# Patient Record
Sex: Female | Born: 1965 | Race: Black or African American | Hispanic: No | Marital: Single | Smoking: Never smoker
Health system: Southern US, Community
[De-identification: ages and names within clinical notes are randomized; demographics above are authoritative.]

## PROBLEM LIST (undated history)

## (undated) DIAGNOSIS — C8590 Non-Hodgkin lymphoma, unspecified, unspecified site: Secondary | ICD-10-CM

## (undated) DIAGNOSIS — I10 Essential (primary) hypertension: Secondary | ICD-10-CM

## (undated) DIAGNOSIS — J45909 Unspecified asthma, uncomplicated: Secondary | ICD-10-CM

## (undated) DIAGNOSIS — C801 Malignant (primary) neoplasm, unspecified: Secondary | ICD-10-CM

## (undated) DIAGNOSIS — E119 Type 2 diabetes mellitus without complications: Secondary | ICD-10-CM

## (undated) HISTORY — PX: TUMOR REMOVAL: SHX12

## (undated) HISTORY — PX: CHOLECYSTECTOMY: SHX55

## (undated) HISTORY — PX: MYOMECTOMY: SHX85

---

## 2008-12-04 ENCOUNTER — Emergency Department (HOSPITAL_COMMUNITY): Admission: EM | Admit: 2008-12-04 | Discharge: 2008-12-04 | Payer: Self-pay | Admitting: Emergency Medicine

## 2010-10-17 LAB — D-DIMER, QUANTITATIVE: D-Dimer, Quant: 0.24 ug/mL-FEU (ref 0.00–0.48)

## 2016-12-20 ENCOUNTER — Encounter (HOSPITAL_COMMUNITY): Payer: Self-pay | Admitting: Family Medicine

## 2016-12-20 DIAGNOSIS — R1011 Right upper quadrant pain: Secondary | ICD-10-CM | POA: Insufficient documentation

## 2016-12-20 DIAGNOSIS — E119 Type 2 diabetes mellitus without complications: Secondary | ICD-10-CM | POA: Insufficient documentation

## 2016-12-20 DIAGNOSIS — I10 Essential (primary) hypertension: Secondary | ICD-10-CM | POA: Insufficient documentation

## 2016-12-20 DIAGNOSIS — R1084 Generalized abdominal pain: Secondary | ICD-10-CM | POA: Diagnosis present

## 2016-12-20 DIAGNOSIS — J45909 Unspecified asthma, uncomplicated: Secondary | ICD-10-CM | POA: Diagnosis not present

## 2016-12-20 LAB — CBC
HEMATOCRIT: 34 % — AB (ref 36.0–46.0)
Hemoglobin: 11.4 g/dL — ABNORMAL LOW (ref 12.0–15.0)
MCH: 28.4 pg (ref 26.0–34.0)
MCHC: 33.5 g/dL (ref 30.0–36.0)
MCV: 84.8 fL (ref 78.0–100.0)
Platelets: 260 10*3/uL (ref 150–400)
RBC: 4.01 MIL/uL (ref 3.87–5.11)
RDW: 14.7 % (ref 11.5–15.5)
WBC: 6.1 10*3/uL (ref 4.0–10.5)

## 2016-12-20 LAB — URINALYSIS, ROUTINE W REFLEX MICROSCOPIC
BACTERIA UA: NONE SEEN
Bilirubin Urine: NEGATIVE
Glucose, UA: NEGATIVE mg/dL
Ketones, ur: 5 mg/dL — AB
NITRITE: NEGATIVE
PROTEIN: 30 mg/dL — AB
SPECIFIC GRAVITY, URINE: 1.027 (ref 1.005–1.030)
pH: 6 (ref 5.0–8.0)

## 2016-12-20 LAB — COMPREHENSIVE METABOLIC PANEL
ALBUMIN: 3.6 g/dL (ref 3.5–5.0)
ALK PHOS: 63 U/L (ref 38–126)
ALT: 57 U/L — AB (ref 14–54)
AST: 54 U/L — AB (ref 15–41)
Anion gap: 9 (ref 5–15)
BILIRUBIN TOTAL: 0.4 mg/dL (ref 0.3–1.2)
BUN: 13 mg/dL (ref 6–20)
CALCIUM: 8.9 mg/dL (ref 8.9–10.3)
CO2: 25 mmol/L (ref 22–32)
Chloride: 102 mmol/L (ref 101–111)
Creatinine, Ser: 0.83 mg/dL (ref 0.44–1.00)
GFR calc Af Amer: >60 mL/min (ref >60)
GFR calc non Af Amer: >60 mL/min (ref >60)
GLUCOSE: 179 mg/dL — AB (ref 65–99)
Potassium: 3.3 mmol/L — ABNORMAL LOW (ref 3.5–5.1)
Sodium: 136 mmol/L (ref 135–145)
TOTAL PROTEIN: 7.2 g/dL (ref 6.5–8.1)

## 2016-12-20 LAB — LIPASE, BLOOD: Lipase: 33 U/L (ref 11–51)

## 2016-12-20 NOTE — ED Triage Notes (Signed)
Patient is complaining of right sided abd pain that started 2 weeks ago but got worse yesterday. Also, complains of diarrhea, loss of appetite, and gas.

## 2016-12-21 ENCOUNTER — Emergency Department (HOSPITAL_COMMUNITY): Payer: Managed Care, Other (non HMO)

## 2016-12-21 ENCOUNTER — Encounter (HOSPITAL_COMMUNITY): Payer: Self-pay

## 2016-12-21 ENCOUNTER — Emergency Department (HOSPITAL_COMMUNITY)
Admission: EM | Admit: 2016-12-21 | Discharge: 2016-12-21 | Disposition: A | Discharge status: Home / Self Care | Payer: Managed Care, Other (non HMO) | Attending: Emergency Medicine | Admitting: Emergency Medicine

## 2016-12-21 DIAGNOSIS — R1011 Right upper quadrant pain: Secondary | ICD-10-CM

## 2016-12-21 HISTORY — DX: Essential (primary) hypertension: I10

## 2016-12-21 HISTORY — DX: Non-Hodgkin lymphoma, unspecified, unspecified site: C85.90

## 2016-12-21 HISTORY — DX: Malignant (primary) neoplasm, unspecified: C80.1

## 2016-12-21 HISTORY — DX: Type 2 diabetes mellitus without complications: E11.9

## 2016-12-21 HISTORY — DX: Unspecified asthma, uncomplicated: J45.909

## 2016-12-21 MED ORDER — IOPAMIDOL (ISOVUE-300) INJECTION 61%
100.0000 mL | Freq: Once | INTRAVENOUS | Status: AC | PRN
  Administered 2016-12-21: 100 mL via INTRAVENOUS

## 2016-12-21 MED ORDER — IOPAMIDOL (ISOVUE-300) INJECTION 61%
INTRAVENOUS | Status: AC
  Filled 2016-12-21: qty 100

## 2016-12-21 MED ORDER — OMEPRAZOLE 20 MG PO CPDR: 20.0000 mg | DELAYED_RELEASE_CAPSULE | Freq: Every day | ORAL | 0 refills | Status: AC

## 2016-12-21 MED ORDER — SUCRALFATE 1 GM/10ML PO SUSP: 1.0000 g | Freq: Three times a day (TID) | ORAL | 0 refills | Status: AC

## 2016-12-21 MED ORDER — HYDROCODONE-ACETAMINOPHEN 5-325 MG PO TABS: 1.0000 | ORAL_TABLET | Freq: Four times a day (QID) | ORAL | 0 refills | Status: AC | PRN

## 2016-12-21 MED ORDER — GI COCKTAIL ~~LOC~~
30.0000 mL | Freq: Once | ORAL | Status: AC
  Administered 2016-12-21: 30 mL via ORAL
  Filled 2016-12-21: qty 30

## 2016-12-21 NOTE — ED Provider Notes (Signed)
[facility] DEPT Provider Note   CSN: [id number] Arrival date & time: 12/20/16  2249     History   Chief Complaint Chief Complaint  Patient presents with  . Abdominal Pain    HPI Donna Pena is a 51 y.o. female.  Patient with PMH of T-cell lymphoma, DM, and HTN presents to the ED with a chief complaint of upper abdominal pain.  She reports that she has had the pain for the past 2 weeks, but it significantly worsened in the past 3 days.  She has prior surgical hx of cholecystectomy. She denies any fevers, chills, n/v/d.  She states that her symptoms do worsen after eating.  She has not tried taking anything for her symptoms.  The symptoms are also worsened with palpation.  Last chemo 3 weeks ago.   The history is provided by the patient. No language interpreter was used.    Past Medical History:  Diagnosis Date  . Asthma   . Cancer ([place])   . Diabetes mellitus without complication ([place])   . Hypertension   . T-cell lymphoma ([place])    On the skin.     There are no active problems to display for this patient.   Past Surgical History:  Procedure Laterality Date  . CHOLECYSTECTOMY    . MYOMECTOMY    . TUMOR REMOVAL     In abd     OB History    No data available       Home Medications    Prior to Admission medications   Not on File    Family History History reviewed. No pertinent family history.  Social History Social History  Substance Use Topics  . Smoking status: Never Smoker  . Smokeless tobacco: Never Used  . Alcohol use No     Allergies   Patient has no allergy information on record.   Review of Systems Review of Systems  All other systems reviewed and are negative.    Physical Exam Updated Vital Signs BP (!) 154/92 (BP Location: Left Arm)   Pulse (!) 103   Temp 99.1 F (37.3 C) (Oral)   Resp 18   Ht 5\' 4"  (1.626 m)   Wt (!) 143.3 kg (316 lb)   LMP 12/18/2016   SpO2 98%   BMI 54.24 kg/m   Physical Exam  Constitutional:  She is oriented to person, place, and time. She appears well-developed and well-nourished.  HENT:  Head: Normocephalic and atraumatic.  Eyes: Conjunctivae and EOM are normal. Pupils are equal, round, and reactive to light.  Neck: Normal range of motion. Neck supple.  Cardiovascular: Normal rate and regular rhythm.  Exam reveals no gallop and no friction rub.   No murmur heard. Pulmonary/Chest: Effort normal and breath sounds normal. No respiratory distress. She has no wheezes. She has no rales. She exhibits no tenderness.  Abdominal: Soft. Bowel sounds are normal. She exhibits no distension and no mass. There is tenderness. There is no rebound and no guarding.  Right upper abdominal tenderness, no other focal abdominal tenderness  Musculoskeletal: Normal range of motion. She exhibits no edema or tenderness.  Neurological: She is alert and oriented to person, place, and time.  Skin: Skin is warm and dry.  Psychiatric: She has a normal mood and affect. Her behavior is normal. Judgment and thought content normal.  Nursing note and vitals reviewed.    ED Treatments / Results  Labs (all labs ordered are listed, but only abnormal results are displayed) Labs Reviewed  COMPREHENSIVE METABOLIC PANEL - Abnormal; Notable for the following:       Result Value   Potassium 3.3 (*)    Glucose, Bld 179 (*)    AST 54 (*)    ALT 57 (*)    All other components within normal limits  CBC - Abnormal; Notable for the following:    Hemoglobin 11.4 (*)    HCT 34.0 (*)    All other components within normal limits  URINALYSIS, ROUTINE W REFLEX MICROSCOPIC - Abnormal; Notable for the following:    APPearance HAZY (*)    Hgb urine dipstick LARGE (*)    Ketones, ur 5 (*)    Protein, ur 30 (*)    Leukocytes, UA TRACE (*)    Squamous Epithelial / LPF 6-30 (*)    All other components within normal limits  LIPASE, BLOOD    EKG  EKG Interpretation None       Radiology No results  found.  Procedures Procedures (including critical care time)  Medications Ordered in ED Medications  gi cocktail (Maalox,Lidocaine,Donnatal) (not administered)     Initial Impression / Assessment and Plan / ED Course  I have reviewed the triage vital signs and the nursing notes.  Pertinent labs & imaging results that were available during my care of the patient were reviewed by me and considered in my medical decision making (see chart for details).     Patient with worsening RUQ abdominal pain.  Barely elevated LFTs.  Prior cholecystectomy 11 years ago.  VSS.  Afebrile.  Patient is fairly tender to palpation.  Given history of cancer and tenderness, will check CT.  Will also try GI cocktail.  No improvement with GI cocktail.  CT shows no acute process.  She does have some enlarged lymph nodes which are likely related to her lymphoma.  She has no evidence of infection.  Her pain does get worse with eating.  Consider PUD.  Will try omeprazole and Carafate, but recommend close follow-up with PCP.  Patient instructed to return for:  New or worsening symptoms, including, increased abdominal pain, especially pain that localizes to one side, bloody vomit, bloody diarrhea, fever >101, and intractable vomiting.   Final Clinical Impressions(s) / ED Diagnoses   Final diagnoses:  RUQ pain    New Prescriptions Discharge Medication List as of 12/21/2016  3:30 AM    START taking these medications   Details  HYDROcodone-acetaminophen (NORCO/VICODIN) 5-325 MG tablet Take 1-2 tablets by mouth every 6 (six) hours as needed., Starting Fri 12/21/2016, Print    omeprazole (PRILOSEC) 20 MG capsule Take 1 capsule (20 mg total) by mouth daily., Starting Fri 12/21/2016, Print    sucralfate (CARAFATE) 1 GM/10ML suspension Take 10 mLs (1 g total) by mouth 4 (four) times daily -  with meals and at bedtime., Starting Fri 12/21/2016, Print         Montine Circle, PA-C 12/21/16 Luevenia Maxin    Rolland Porter,  MD 12/21/16 [phone]

## 2016-12-21 NOTE — ED Notes (Signed)
Patient transported to CT 

## 2018-04-24 IMAGING — CT CT ABD-PELV W/ CM
2 of 5 series · 16 of 46 positions shown, 18 images · IV contrast (isovue)
Comparison: None.

CLINICAL DATA: Right upper abdominal pain for 2 weeks, worse in the
last 24 hours

EXAM:
CT ABDOMEN AND PELVIS WITH CONTRAST
TECHNIQUE: Multidetector CT imaging of the abdomen and pelvis was performed
using the standard protocol following bolus administration of
intravenous contrast.
CONTRAST:  100 cc [id number] intravenous

[Series 2: abd/pel with · axial · 0.85mm/px · z∈[-678,-278]mm · 13 of 92 slices shown, 15 images]
[im 6/92  soft-tissue]
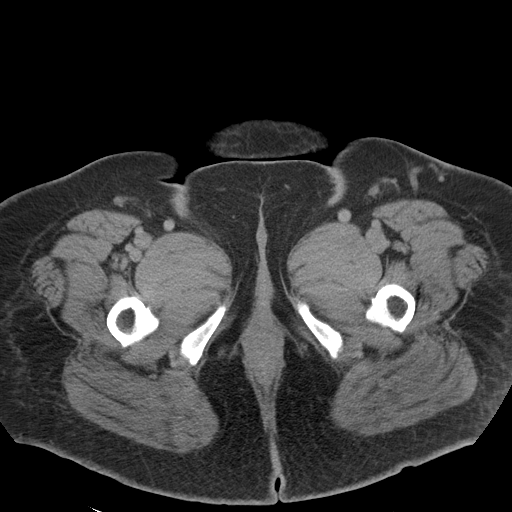
[im 6/92  bone]
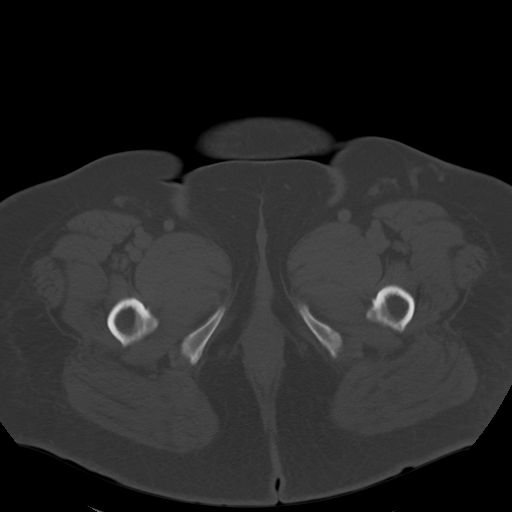
[im 12/92  soft-tissue]
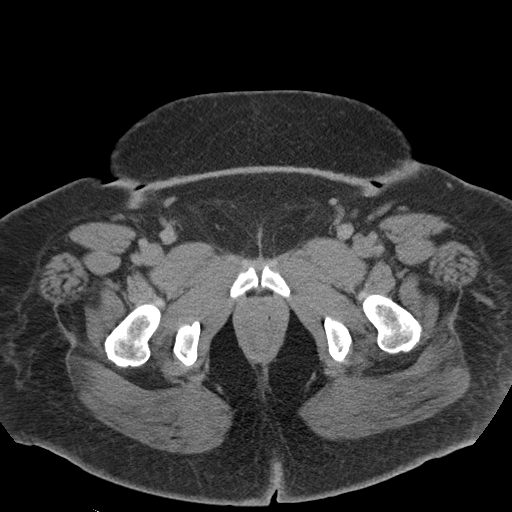
[im 18/92  soft-tissue]
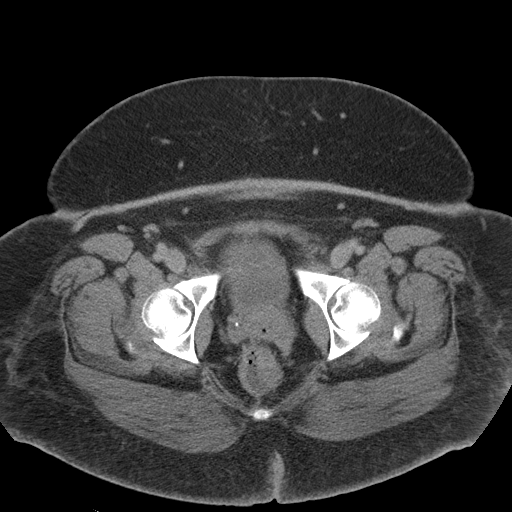
[im 29/92  soft-tissue]
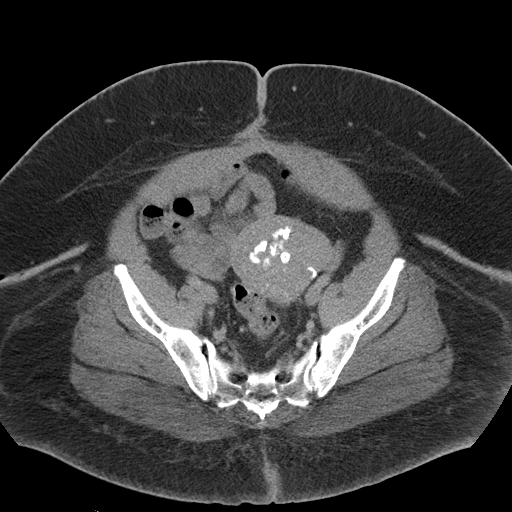
[im 35/92  soft-tissue]
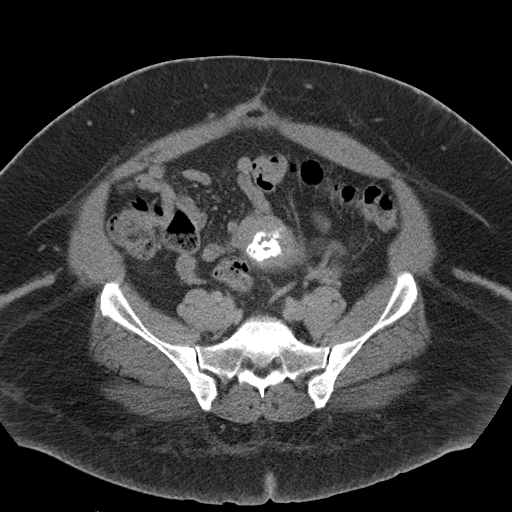
[im 40/92  soft-tissue]
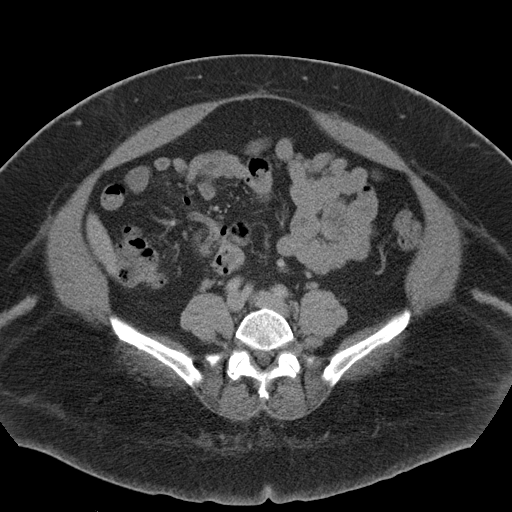
[im 46/92  soft-tissue]
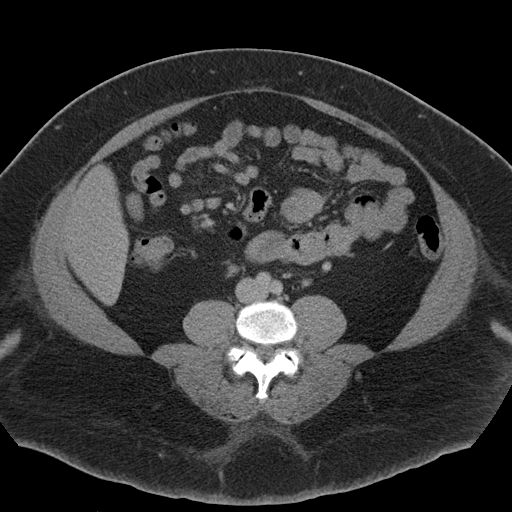
[im 52/92  soft-tissue]
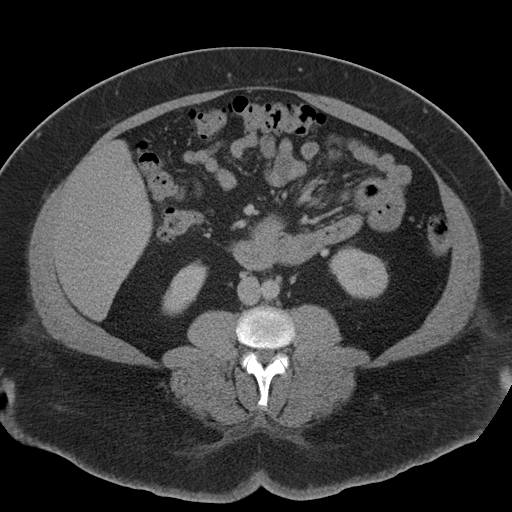
[im 57/92  soft-tissue]
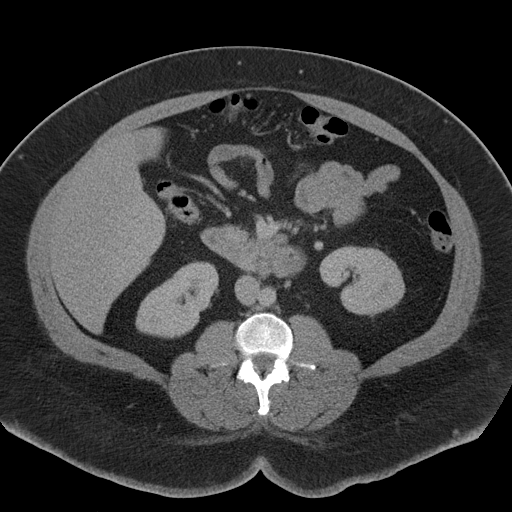
[im 57/92  bone]
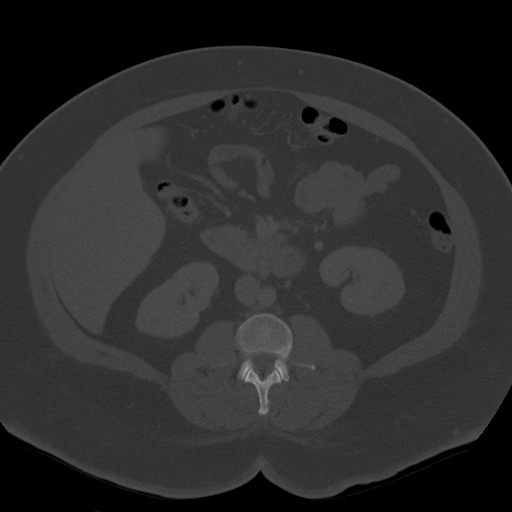
[im 63/92  soft-tissue]
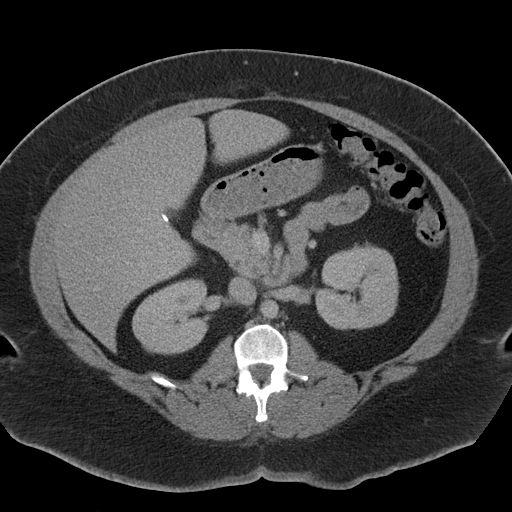
[im 74/92  soft-tissue]
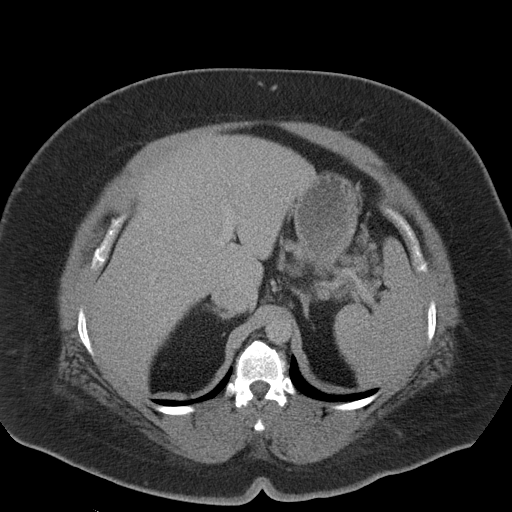
[im 80/92  soft-tissue]
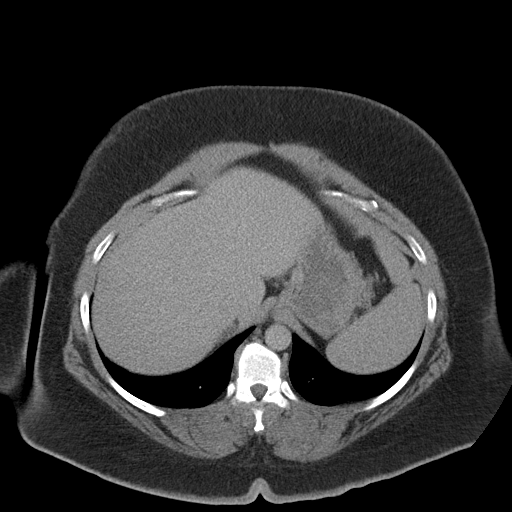
[im 86/92  soft-tissue]
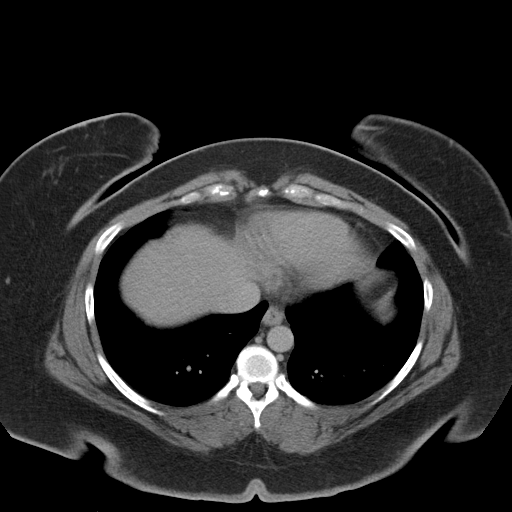

[Series 5: coronal a/|p · coronal · 0.81mm/px · 3 of 179 slices shown]
[im 60/179  soft-tissue]
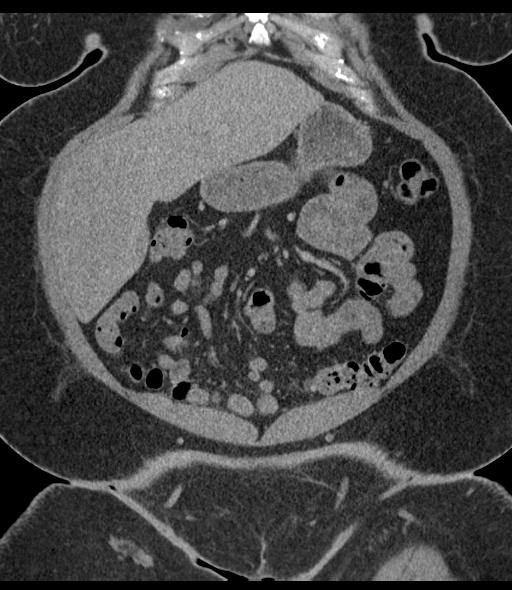
[im 80/179  soft-tissue]
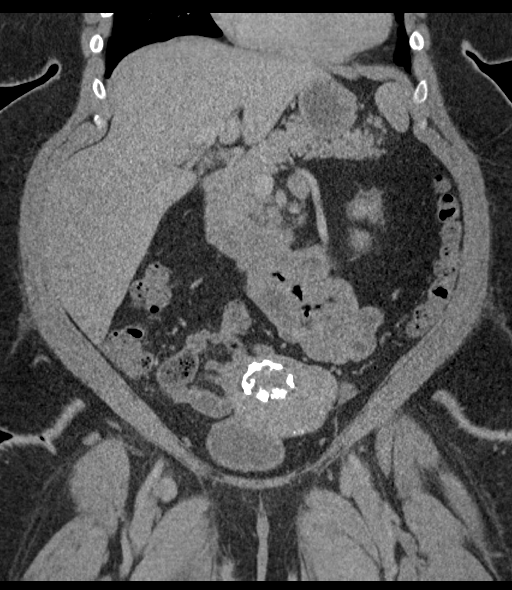
[im 99/179  soft-tissue]
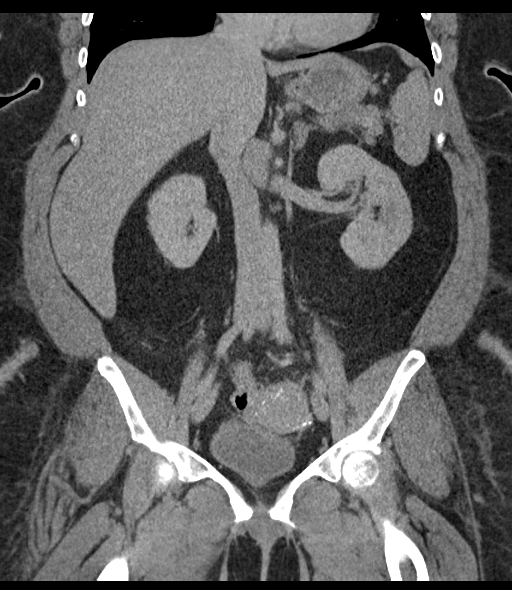

[16 of 46 positions shown; findings below may reference images not displayed]

FINDINGS: Lower chest: Lung bases demonstrate no acute consolidation or
pleural effusion. Heart size within normal limits.

Hepatobiliary: No focal liver abnormality is seen. Status post
cholecystectomy. No biliary dilatation.

Pancreas: Unremarkable. No pancreatic ductal dilatation or
surrounding inflammatory changes.

Spleen: Normal in size without focal abnormality.

Adrenals/Urinary Tract: Adrenal glands are within normal limits.
Kidneys show no hydronephrosis. The bladder is unremarkable.

Stomach/Bowel: Stomach is within normal limits. Appendix appears
normal. No evidence of bowel wall thickening, distention, or
inflammatory changes.

Vascular/Lymphatic: Non aneurysmal aorta. No retroperitoneal nodes.
Enlarged portal caval lymph node measuring 22 mm. Multiple clustered
lymph nodes at the splenic hilum and adjacent to the tail of the
pancreas, measuring up to 1 cm. Matted gastrohepatic lymph nodes.

Reproductive: Enlarged uterus with multiple calcified masses
presumably fibroids. No adnexal mass.

Other: No free air or free fluid. Ventral scar. Small near the
umbilicus.

Musculoskeletal: No acute or suspicious bone lesion.
IMPRESSION: 1. No right upper quadrant inflammatory process is seen.
2. Multiple enlarged upper abdominal lymph nodes, which may be
related to history of lymphoma.
3. Enlarged uterus with multiple calcified masses, most consistent
with fibroids.
